# Patient Record
Sex: Female | Born: 1955 | Race: White | Hispanic: No | Marital: Married | Smoking: Never smoker
Health system: Southern US, Community
[De-identification: ages and names within clinical notes are randomized; demographics above are authoritative.]

## PROBLEM LIST (undated history)

## (undated) DIAGNOSIS — T7840XA Allergy, unspecified, initial encounter: Secondary | ICD-10-CM

## (undated) DIAGNOSIS — M810 Age-related osteoporosis without current pathological fracture: Secondary | ICD-10-CM

## (undated) DIAGNOSIS — D369 Benign neoplasm, unspecified site: Secondary | ICD-10-CM

## (undated) HISTORY — PX: TONSILLECTOMY: SUR1361

## (undated) HISTORY — DX: Benign neoplasm, unspecified site: D36.9

## (undated) HISTORY — DX: Allergy, unspecified, initial encounter: T78.40XA

## (undated) HISTORY — PX: OTHER SURGICAL HISTORY: SHX169

## (undated) HISTORY — PX: BREAST BIOPSY: SHX20

## (undated) HISTORY — PX: POLYPECTOMY: SHX149

## (undated) HISTORY — PX: COLONOSCOPY: SHX174

## (undated) HISTORY — PX: WISDOM TOOTH EXTRACTION: SHX21

## (undated) HISTORY — DX: Age-related osteoporosis without current pathological fracture: M81.0

---

## 1998-01-08 ENCOUNTER — Other Ambulatory Visit: Admission: RE | Admit: 1998-01-08 | Discharge: 1998-01-08 | Payer: Self-pay | Admitting: Obstetrics and Gynecology

## 1999-05-25 ENCOUNTER — Other Ambulatory Visit: Admission: RE | Admit: 1999-05-25 | Discharge: 1999-05-25 | Payer: Self-pay | Admitting: *Deleted

## 2000-05-29 ENCOUNTER — Other Ambulatory Visit: Admission: RE | Admit: 2000-05-29 | Discharge: 2000-05-29 | Payer: Self-pay | Admitting: *Deleted

## 2001-06-04 ENCOUNTER — Other Ambulatory Visit: Admission: RE | Admit: 2001-06-04 | Discharge: 2001-06-04 | Payer: Self-pay | Admitting: Obstetrics and Gynecology

## 2002-06-03 ENCOUNTER — Other Ambulatory Visit: Admission: RE | Admit: 2002-06-03 | Discharge: 2002-06-03 | Payer: Self-pay | Admitting: Obstetrics and Gynecology

## 2003-04-06 ENCOUNTER — Encounter: Admission: RE | Admit: 2003-04-06 | Discharge: 2003-04-06 | Payer: Self-pay | Admitting: Obstetrics and Gynecology

## 2003-06-10 ENCOUNTER — Other Ambulatory Visit: Admission: RE | Admit: 2003-06-10 | Discharge: 2003-06-10 | Payer: Self-pay | Admitting: Obstetrics and Gynecology

## 2003-09-30 ENCOUNTER — Encounter: Admission: RE | Admit: 2003-09-30 | Discharge: 2003-09-30 | Payer: Self-pay | Admitting: Obstetrics and Gynecology

## 2005-06-09 ENCOUNTER — Encounter: Admission: RE | Admit: 2005-06-09 | Discharge: 2005-06-09 | Payer: Self-pay | Admitting: Obstetrics and Gynecology

## 2008-01-08 ENCOUNTER — Encounter: Admission: RE | Admit: 2008-01-08 | Discharge: 2008-01-08 | Payer: Self-pay | Admitting: Obstetrics and Gynecology

## 2009-01-26 ENCOUNTER — Encounter: Admission: RE | Admit: 2009-01-26 | Discharge: 2009-01-26 | Payer: Self-pay | Admitting: Obstetrics and Gynecology

## 2010-08-02 ENCOUNTER — Other Ambulatory Visit: Payer: Self-pay | Admitting: Obstetrics and Gynecology

## 2010-08-02 DIAGNOSIS — Z780 Asymptomatic menopausal state: Secondary | ICD-10-CM

## 2010-08-12 ENCOUNTER — Ambulatory Visit
Admission: RE | Admit: 2010-08-12 | Discharge: 2010-08-12 | Disposition: A | Discharge status: Home / Self Care | Payer: 59 | Source: Ambulatory Visit | Attending: Obstetrics and Gynecology | Admitting: Obstetrics and Gynecology

## 2010-08-12 DIAGNOSIS — Z780 Asymptomatic menopausal state: Secondary | ICD-10-CM

## 2012-10-04 ENCOUNTER — Other Ambulatory Visit: Payer: Self-pay | Admitting: Obstetrics and Gynecology

## 2012-10-04 DIAGNOSIS — N644 Mastodynia: Secondary | ICD-10-CM

## 2012-10-18 ENCOUNTER — Ambulatory Visit
Admission: RE | Admit: 2012-10-18 | Discharge: 2012-10-18 | Disposition: A | Discharge status: Home / Self Care | Payer: 59 | Source: Ambulatory Visit | Attending: Obstetrics and Gynecology | Admitting: Obstetrics and Gynecology

## 2012-10-18 DIAGNOSIS — N644 Mastodynia: Secondary | ICD-10-CM

## 2013-03-04 ENCOUNTER — Encounter: Payer: Self-pay | Admitting: Internal Medicine

## 2013-04-28 ENCOUNTER — Ambulatory Visit (AMBULATORY_SURGERY_CENTER): Payer: Self-pay | Admitting: *Deleted

## 2013-04-28 VITALS — Ht 64.0 in | Wt 143.0 lb

## 2013-04-28 DIAGNOSIS — Z1211 Encounter for screening for malignant neoplasm of colon: Secondary | ICD-10-CM

## 2013-04-28 MED ORDER — PEG-KCL-NACL-NASULF-NA ASC-C 100 G PO SOLR: ORAL | Status: DC

## 2013-04-28 NOTE — Progress Notes (Signed)
No egg or soy allergy 

## 2013-05-06 ENCOUNTER — Encounter: Payer: Self-pay | Admitting: Internal Medicine

## 2013-05-12 ENCOUNTER — Encounter: Payer: Self-pay | Admitting: Internal Medicine

## 2013-05-12 ENCOUNTER — Ambulatory Visit (AMBULATORY_SURGERY_CENTER): Payer: 59 | Admitting: Internal Medicine

## 2013-05-12 VITALS — BP 140/53 | HR 70 | Temp 98.9°F | Resp 19 | Ht 64.0 in | Wt 143.0 lb

## 2013-05-12 DIAGNOSIS — Z1211 Encounter for screening for malignant neoplasm of colon: Secondary | ICD-10-CM

## 2013-05-12 DIAGNOSIS — D126 Benign neoplasm of colon, unspecified: Secondary | ICD-10-CM

## 2013-05-12 MED ORDER — SODIUM CHLORIDE 0.9 % IV SOLN: 500.0000 mL | INTRAVENOUS | Status: DC

## 2013-05-12 NOTE — Progress Notes (Signed)
Called to room to assist during endoscopic procedure.  Patient ID and intended procedure confirmed with present staff. Received instructions for my participation in the procedure from the performing physician.  

## 2013-05-12 NOTE — Progress Notes (Addendum)
NO EGG OR SOY ALLERGY. EWM NO PROBLEMS WITH PAST SEDATION. EWM PT STATES HER MOTHER HAD COLON CANCER AND THIS IS HER FIRST COLONOSCOPY. EMW

## 2013-05-12 NOTE — Patient Instructions (Signed)
Discharge instructions given with verbal understanding. Handout on polyps. Resume previous medications. YOU HAD AN ENDOSCOPIC PROCEDURE TODAY AT THE [facility]: Refer to the procedure report that was given to you for any specific questions about what was found during the examination.  If the procedure report does not answer your questions, please call your gastroenterologist to clarify.  If you requested that your care partner not be given the details of your procedure findings, then the procedure report has been included in a sealed envelope for you to review at your convenience later.  YOU SHOULD EXPECT: Some feelings of bloating in the abdomen. Passage of more gas than usual.  Walking can help get rid of the air that was put into your GI tract during the procedure and reduce the bloating. If you had a lower endoscopy (such as a colonoscopy or flexible sigmoidoscopy) you may notice spotting of blood in your stool or on the toilet paper. If you underwent a bowel prep for your procedure, then you may not have a normal bowel movement for a few days.  DIET: Your first meal following the procedure should be a light meal and then it is ok to progress to your normal diet.  A half-sandwich or bowl of soup is an example of a good first meal.  Heavy or fried foods are harder to digest and may make you feel nauseous or bloated.  Likewise meals heavy in dairy and vegetables can cause extra gas to form and this can also increase the bloating.  Drink plenty of fluids but you should avoid alcoholic beverages for 24 hours.  ACTIVITY: Your care partner should take you home directly after the procedure.  You should plan to take it easy, moving slowly for the rest of the day.  You can resume normal activity the day after the procedure however you should NOT DRIVE or use heavy machinery for 24 hours (because of the sedation medicines used during the test).    SYMPTOMS TO REPORT IMMEDIATELY: A  gastroenterologist can be reached at any hour.  During normal business hours, 8:30 AM to 5:00 PM Monday through Friday, call [phone].  After hours and on weekends, please call the GI answering service at [phone] who will take a message and have the physician on call contact you.   Following lower endoscopy (colonoscopy or flexible sigmoidoscopy):  Excessive amounts of blood in the stool  Significant tenderness or worsening of abdominal pains  Swelling of the abdomen that is new, acute  Fever of 100F or higher  FOLLOW UP: If any biopsies were taken you will be contacted by phone or by letter within the next 1-3 weeks.  Call your gastroenterologist if you have not heard about the biopsies in 3 weeks.  Our staff will call the home number listed on your records the next business day following your procedure to check on you and address any questions or concerns that you may have at that time regarding the information given to you following your procedure. This is a courtesy call and so if there is no answer at the home number and we have not heard from you through the emergency physician on call, we will assume that you have returned to your regular daily activities without incident.  SIGNATURES/CONFIDENTIALITY: You and/or your care partner have signed paperwork which will be entered into your electronic medical record.  These signatures attest to the fact that that the information above on your After Visit Summary has been   reviewed and is understood.  Full responsibility of the confidentiality of this discharge information lies with you and/or your care-partner. 

## 2013-05-12 NOTE — Op Note (Signed)
[facility]  [address] [place], [address]   COLONOSCOPY PROCEDURE REPORT  PATIENT: [name], [name]  MR#: [id number] BIRTHDATE: [birth year] , 32  yrs. old GENDER: Female ENDOSCOPIST: Eustace Quail, MD REFERRED YC:[id number] Tisovec, M.D. PROCEDURE DATE:  05/12/2013 PROCEDURE:   Colonoscopy with snare polypectomy x 2 First Screening Colonoscopy - Avg.  risk and is 50 yrs.  old or older Yes.  Prior Negative Screening - Now for repeat screening. N/A  History of Adenoma - Now for follow-up colonoscopy & has been > or = to 3 yrs.  N/A  Polyps Removed Today? Yes. ASA CLASS:   Class I INDICATIONS:average risk screening. MEDICATIONS: MAC sedation, administered by CRNA and propofol (Diprivan) 400mg  IV  DESCRIPTION OF PROCEDURE:   After the risks benefits and alternatives of the procedure were thoroughly explained, informed consent was obtained.  A digital rectal exam revealed no abnormalities of the rectum.   The LB [id number]  endoscope was introduced through the anus and advanced to the cecum, which was identified by both the appendix and ileocecal valve. No adverse events experienced.   The quality of the prep was excellent, using MoviPrep  The instrument was then slowly withdrawn as the colon was fully examined.      COLON FINDINGS: Two sessile polyps were found; 11 mm in the ascending colon and 7 mm in the transverse colon. The polyps were removed with snare cautery and retrieved for pathologic submission. The colon mucosa was otherwise normal throughout.  Retroflexed views revealed internal hemorrhoids. The time to cecum=3 minutes 25 seconds.  Withdrawal time=14 minutes 0 seconds.  The scope was withdrawn and the procedure completed. COMPLICATIONS: There were no complications.  ENDOSCOPIC IMPRESSION: 1.   Two sessile polyps were found in the ascending colon and transverse colon ; status post cold snare polypectomy 2.   The colon mucosa was  otherwise normal  RECOMMENDATIONS: 1. Repeat Colonoscopy in 3 years (greater than 1 cm adenoma).   eSigned:  Eustace Quail, MD 05/12/2013 10:16 AM   cc: Domenick Gong, MD and The Patient   PATIENT NAME:  Melissa Oliver, Melissa Oliver MR#: [id number]

## 2013-05-12 NOTE — Progress Notes (Signed)
Report to pacu rn, vss, bbs=clear 

## 2013-05-13 ENCOUNTER — Telehealth: Payer: Self-pay | Admitting: *Deleted

## 2013-05-13 NOTE — Telephone Encounter (Signed)
  Follow up Call-  Call back number 05/12/2013  Post procedure Call Back phone  # [phone]  Permission to leave phone message Yes     Patient questions:  Do you have a fever, pain , or abdominal swelling? no Pain Score  0 *  Have you tolerated food without any problems? yes  Have you been able to return to your normal activities? yes  Do you have any questions about your discharge instructions: Diet   no Medications  no Follow up visit  no  Do you have questions or concerns about your Care? no  Actions: * If pain score is 4 or above: No action needed, pain <4.

## 2013-05-15 ENCOUNTER — Encounter: Payer: Self-pay | Admitting: Internal Medicine

## 2016-04-05 ENCOUNTER — Other Ambulatory Visit: Payer: Self-pay | Admitting: Internal Medicine

## 2016-04-05 DIAGNOSIS — Z1231 Encounter for screening mammogram for malignant neoplasm of breast: Secondary | ICD-10-CM

## 2016-05-03 ENCOUNTER — Ambulatory Visit
Admission: RE | Admit: 2016-05-03 | Discharge: 2016-05-03 | Disposition: A | Discharge status: Home / Self Care | Payer: 59 | Source: Ambulatory Visit | Attending: Internal Medicine | Admitting: Internal Medicine

## 2016-05-03 DIAGNOSIS — Z1231 Encounter for screening mammogram for malignant neoplasm of breast: Secondary | ICD-10-CM

## 2016-05-09 ENCOUNTER — Encounter: Payer: Self-pay | Admitting: Internal Medicine

## 2017-03-06 DIAGNOSIS — H02831 Dermatochalasis of right upper eyelid: Secondary | ICD-10-CM | POA: Diagnosis not present

## 2017-03-06 DIAGNOSIS — H2513 Age-related nuclear cataract, bilateral: Secondary | ICD-10-CM | POA: Diagnosis not present

## 2017-03-06 DIAGNOSIS — H04123 Dry eye syndrome of bilateral lacrimal glands: Secondary | ICD-10-CM | POA: Diagnosis not present

## 2017-03-27 DIAGNOSIS — Z0000 Encounter for general adult medical examination without abnormal findings: Secondary | ICD-10-CM | POA: Diagnosis not present

## 2017-03-27 DIAGNOSIS — M859 Disorder of bone density and structure, unspecified: Secondary | ICD-10-CM | POA: Diagnosis not present

## 2017-04-03 ENCOUNTER — Other Ambulatory Visit: Payer: Self-pay | Admitting: Internal Medicine

## 2017-04-03 DIAGNOSIS — M5489 Other dorsalgia: Secondary | ICD-10-CM | POA: Diagnosis not present

## 2017-04-03 DIAGNOSIS — Z1231 Encounter for screening mammogram for malignant neoplasm of breast: Secondary | ICD-10-CM

## 2017-04-03 DIAGNOSIS — Z0000 Encounter for general adult medical examination without abnormal findings: Secondary | ICD-10-CM | POA: Diagnosis not present

## 2017-04-03 DIAGNOSIS — D126 Benign neoplasm of colon, unspecified: Secondary | ICD-10-CM | POA: Diagnosis not present

## 2017-04-03 DIAGNOSIS — G4709 Other insomnia: Secondary | ICD-10-CM | POA: Diagnosis not present

## 2017-04-05 ENCOUNTER — Other Ambulatory Visit: Payer: Self-pay | Admitting: Internal Medicine

## 2017-04-05 DIAGNOSIS — R5381 Other malaise: Secondary | ICD-10-CM

## 2017-04-06 ENCOUNTER — Other Ambulatory Visit: Payer: Self-pay | Admitting: Internal Medicine

## 2017-04-06 DIAGNOSIS — M8580 Other specified disorders of bone density and structure, unspecified site: Secondary | ICD-10-CM

## 2017-04-06 DIAGNOSIS — E559 Vitamin D deficiency, unspecified: Secondary | ICD-10-CM

## 2017-06-05 ENCOUNTER — Ambulatory Visit
Admission: RE | Admit: 2017-06-05 | Discharge: 2017-06-05 | Disposition: A | Discharge status: Home / Self Care | Payer: 59 | Source: Ambulatory Visit | Attending: Internal Medicine | Admitting: Internal Medicine

## 2017-06-05 DIAGNOSIS — M8580 Other specified disorders of bone density and structure, unspecified site: Secondary | ICD-10-CM

## 2017-06-05 DIAGNOSIS — Z780 Asymptomatic menopausal state: Secondary | ICD-10-CM | POA: Diagnosis not present

## 2017-06-05 DIAGNOSIS — M810 Age-related osteoporosis without current pathological fracture: Secondary | ICD-10-CM | POA: Diagnosis not present

## 2017-06-05 DIAGNOSIS — Z1231 Encounter for screening mammogram for malignant neoplasm of breast: Secondary | ICD-10-CM

## 2017-06-05 DIAGNOSIS — E559 Vitamin D deficiency, unspecified: Secondary | ICD-10-CM

## 2018-04-01 DIAGNOSIS — Z0000 Encounter for general adult medical examination without abnormal findings: Secondary | ICD-10-CM | POA: Diagnosis not present

## 2018-04-01 DIAGNOSIS — E559 Vitamin D deficiency, unspecified: Secondary | ICD-10-CM | POA: Diagnosis not present

## 2018-04-01 DIAGNOSIS — R82998 Other abnormal findings in urine: Secondary | ICD-10-CM | POA: Diagnosis not present

## 2018-04-08 DIAGNOSIS — M5489 Other dorsalgia: Secondary | ICD-10-CM | POA: Diagnosis not present

## 2018-04-08 DIAGNOSIS — Z1389 Encounter for screening for other disorder: Secondary | ICD-10-CM | POA: Diagnosis not present

## 2018-04-08 DIAGNOSIS — D126 Benign neoplasm of colon, unspecified: Secondary | ICD-10-CM | POA: Diagnosis not present

## 2018-04-08 DIAGNOSIS — M859 Disorder of bone density and structure, unspecified: Secondary | ICD-10-CM | POA: Diagnosis not present

## 2018-04-08 DIAGNOSIS — Z0000 Encounter for general adult medical examination without abnormal findings: Secondary | ICD-10-CM | POA: Diagnosis not present

## 2018-04-09 DIAGNOSIS — Z1212 Encounter for screening for malignant neoplasm of rectum: Secondary | ICD-10-CM | POA: Diagnosis not present

## 2019-05-15 ENCOUNTER — Encounter: Payer: Self-pay | Admitting: Internal Medicine

## 2019-05-27 ENCOUNTER — Ambulatory Visit (AMBULATORY_SURGERY_CENTER): Payer: Self-pay | Admitting: *Deleted

## 2019-05-27 ENCOUNTER — Other Ambulatory Visit: Payer: Self-pay

## 2019-05-27 VITALS — Temp 96.8°F | Ht 62.5 in | Wt 142.0 lb

## 2019-05-27 DIAGNOSIS — Z01818 Encounter for other preprocedural examination: Secondary | ICD-10-CM

## 2019-05-27 DIAGNOSIS — Z86010 Personal history of colonic polyps: Secondary | ICD-10-CM

## 2019-05-27 DIAGNOSIS — Z800 Family history of malignant neoplasm of digestive organs: Secondary | ICD-10-CM

## 2019-05-27 MED ORDER — SUPREP BOWEL PREP KIT 17.5-3.13-1.6 GM/177ML PO SOLN: 1.0000 | Freq: Once | ORAL | 0 refills | Status: AC

## 2019-05-27 NOTE — Progress Notes (Signed)
No egg or soy allergy known to patient  No issues with past sedation with any surgeries  or procedures, no intubation problems  No diet pills per patient No home 02 use per patient  No blood thinners per patient  Pt denies issues with constipation  No A fib or A flutter  EMMI video sent to pt's e mail  Suprep $15 coupon   Due to the COVID-19 pandemic we are asking patients to follow these guidelines. Please only bring one care partner. Please be aware that your care partner may wait in the car in the parking lot or if they feel like they will be too hot to wait in the car, they may wait in the lobby on the 4th floor. All care partners are required to wear a mask the entire time (we do not have any that we can provide them), they need to practice social distancing, and we will do a Covid check for all patient's and care partners when you arrive. Also we will check their temperature and your temperature. If the care partner waits in their car they need to stay in the parking lot the entire time and we will call them on their cell phone when the patient is ready for discharge so they can bring the car to the front of the building. Also all patient's will need to wear a mask into building.

## 2019-06-05 ENCOUNTER — Encounter: Payer: Self-pay | Admitting: Internal Medicine

## 2019-06-06 ENCOUNTER — Other Ambulatory Visit: Payer: Self-pay | Admitting: Internal Medicine

## 2019-06-06 ENCOUNTER — Ambulatory Visit (INDEPENDENT_AMBULATORY_CARE_PROVIDER_SITE_OTHER): Payer: 59

## 2019-06-06 ENCOUNTER — Other Ambulatory Visit: Payer: Self-pay

## 2019-06-06 DIAGNOSIS — Z1159 Encounter for screening for other viral diseases: Secondary | ICD-10-CM

## 2019-06-07 LAB — SARS CORONAVIRUS 2 (TAT 6-24 HRS): SARS Coronavirus 2: NEGATIVE

## 2019-06-10 ENCOUNTER — Ambulatory Visit (AMBULATORY_SURGERY_CENTER): Payer: 59 | Admitting: Internal Medicine

## 2019-06-10 ENCOUNTER — Other Ambulatory Visit: Payer: Self-pay

## 2019-06-10 ENCOUNTER — Encounter: Payer: Self-pay | Admitting: Internal Medicine

## 2019-06-10 VITALS — BP 109/49 | HR 70 | Temp 97.1°F | Resp 13 | Ht 62.0 in | Wt 142.0 lb

## 2019-06-10 DIAGNOSIS — D120 Benign neoplasm of cecum: Secondary | ICD-10-CM

## 2019-06-10 DIAGNOSIS — D123 Benign neoplasm of transverse colon: Secondary | ICD-10-CM

## 2019-06-10 DIAGNOSIS — Z86010 Personal history of colonic polyps: Secondary | ICD-10-CM | POA: Diagnosis present

## 2019-06-10 DIAGNOSIS — D122 Benign neoplasm of ascending colon: Secondary | ICD-10-CM

## 2019-06-10 MED ORDER — SODIUM CHLORIDE 0.9 % IV SOLN: 500.0000 mL | Freq: Once | INTRAVENOUS | Status: DC

## 2019-06-10 NOTE — Patient Instructions (Signed)
YOU HAD AN ENDOSCOPIC PROCEDURE TODAY AT [facility]:   Refer to the procedure report that was given to you for any specific questions about what was found during the examination.  If the procedure report does not answer your questions, please call your gastroenterologist to clarify.  If you requested that your care partner not be given the details of your procedure findings, then the procedure report has been included in a sealed envelope for you to review at your convenience later.  YOU SHOULD EXPECT: Some feelings of bloating in the abdomen. Passage of more gas than usual.  Walking can help get rid of the air that was put into your GI tract during the procedure and reduce the bloating. If you had a lower endoscopy (such as a colonoscopy or flexible sigmoidoscopy) you may notice spotting of blood in your stool or on the toilet paper. If you underwent a bowel prep for your procedure, you may not have a normal bowel movement for a few days.  Please Note:  You might notice some irritation and congestion in your nose or some drainage.  This is from the oxygen used during your procedure.  There is no need for concern and it should clear up in a day or so.  SYMPTOMS TO REPORT IMMEDIATELY:   Following lower endoscopy (colonoscopy or flexible sigmoidoscopy):  Excessive amounts of blood in the stool  Significant tenderness or worsening of abdominal pains  Swelling of the abdomen that is new, acute  Fever of 100F or higher   For urgent or emergent issues, a gastroenterologist can be reached at any hour by calling [phone].   DIET:  We do recommend a small meal at first, but then you may proceed to your regular diet.  Drink plenty of fluids but you should avoid alcoholic beverages for 24 hours.  ACTIVITY:  You should plan to take it easy for the rest of today and you should NOT DRIVE or use heavy machinery until tomorrow (because of the sedation medicines used during the test).     FOLLOW UP: Our staff will call the number listed on your records 48-72 hours following your procedure to check on you and address any questions or concerns that you may have regarding the information given to you following your procedure. If we do not reach you, we will leave a message.  We will attempt to reach you two times.  During this call, we will ask if you have developed any symptoms of COVID 19. If you develop any symptoms (ie: fever, flu-like symptoms, shortness of breath, cough etc.) before then, please call [phone].  If you test positive for Covid 19 in the 2 weeks post procedure, please call and report this information to [place].    If any biopsies were taken you will be contacted by phone or by letter within the next 1-3 weeks.  Please call us at [phone] if you have not heard about the biopsies in 3 weeks.    SIGNATURES/CONFIDENTIALITY: You and/or your care partner have signed paperwork which will be entered into your electronic medical record.  These signatures attest to the fact that that the information above on your After Visit Summary has been reviewed and is understood.  Full responsibility of the confidentiality of this discharge information lies with you and/or your care-partner.  Resume medications. Information given on polyps and hemorrhoids.

## 2019-06-10 NOTE — Progress Notes (Signed)
Called to room to assist during endoscopic procedure.  Patient ID and intended procedure confirmed with present staff. Received instructions for my participation in the procedure from the performing physician.  

## 2019-06-10 NOTE — Op Note (Signed)
[facility] Patient Name: Melissa Oliver Procedure Date: 06/10/2019 8:29 AM MRN: [id number] Endoscopist: Docia Chuck. Henrene Pastor , MD Age: 64 Referring MD:  Date of Birth: 1956/03/17 Gender: Female Account #: [id number] Procedure:                Colonoscopy with cold snare polypectomy x 5 Indications:              High risk colon cancer surveillance: Personal                            history of adenoma (10 mm or greater in size).                            Index examination January 2015 (overdue for                            follow-up) Medicines:                Monitored Anesthesia Care Procedure:                Pre-Anesthesia Assessment:                           - Prior to the procedure, a History and Physical                            was performed, and patient medications and                            allergies were reviewed. The patient's tolerance of                            previous anesthesia was also reviewed. The risks                            and benefits of the procedure and the sedation                            options and risks were discussed with the patient.                            All questions were answered, and informed consent                            was obtained. Prior Anticoagulants: The patient has                            taken no previous anticoagulant or antiplatelet                            agents. ASA Grade Assessment: II - A patient with                            mild systemic disease. After reviewing the risks  and benefits, the patient was deemed in                            satisfactory condition to undergo the procedure.                           After obtaining informed consent, the colonoscope                            was passed under direct vision. Throughout the                            procedure, the patient's blood pressure, pulse, and                            oxygen saturations were  monitored continuously. The                            Colonoscope was introduced through the anus and                            advanced to the the cecum, identified by                            appendiceal orifice and ileocecal valve. The                            ileocecal valve, appendiceal orifice, and rectum                            were photographed. The quality of the bowel                            preparation was excellent. The colonoscopy was                            performed without difficulty. The patient tolerated                            the procedure well. The bowel preparation used was                            SUPREP via split dose instruction. Scope In: 8:38:57 AM Scope Out: 8:58:54 AM Scope Withdrawal Time: 0 hours 15 minutes 48 seconds  Total Procedure Duration: 0 hours 19 minutes 57 seconds  Findings:                 Five sessile polyps were found in the transverse                            colon, ascending colon and cecum. The polyps were 1                            to 10 mm in size. These polyps were removed  with a                            cold snare. Resection and retrieval were complete.                           The exam was otherwise without abnormality on                            direct and retroflexion views. Internal hemorrhoids                            present. Complications:            No immediate complications. Estimated blood loss:                            None. Estimated Blood Loss:     Estimated blood loss: none. Impression:               - Five 1 to 10 mm polyps in the transverse colon,                            in the ascending colon and in the cecum, removed                            with a cold snare. Resected and retrieved.                           - The examination was otherwise normal on direct                            and retroflexion views. Internal hemorrhoids                            present. Recommendation:            - Repeat colonoscopy in 3 years for surveillance.                           - Patient has a contact number available for                            emergencies. The signs and symptoms of potential                            delayed complications were discussed with the                            patient. Return to normal activities tomorrow.                            Written discharge instructions were provided to the                            patient.                           -  Resume previous diet.                           - Continue present medications.                           - Await pathology results. Docia Chuck. Henrene Pastor, MD 06/10/2019 9:05:12 AM This report has been signed electronically.

## 2019-06-10 NOTE — Progress Notes (Signed)
Pt's states no medical or surgical changes since previsit or office visit. VS by DT. Temp by JB.

## 2019-06-10 NOTE — Progress Notes (Signed)
PT taken to PACU. Monitors in place. VSS. Report given to RN. 

## 2019-06-12 ENCOUNTER — Telehealth: Payer: Self-pay | Admitting: *Deleted

## 2019-06-12 ENCOUNTER — Encounter: Payer: Self-pay | Admitting: Internal Medicine

## 2019-06-12 ENCOUNTER — Telehealth: Payer: Self-pay

## 2019-06-12 NOTE — Telephone Encounter (Signed)
Attempted to reach patient for post-procedure f/u call. No answer. Left message that we will make another attempt to reach her later today and for her to please not hesitate to call us if she has any questions/concerns.

## 2019-06-12 NOTE — Telephone Encounter (Signed)
  Follow up Call-  Call back number 06/10/2019  Post procedure Call Back phone  # [phone]  Permission to leave phone message Yes  Some recent data might be hidden     Patient questions:  Do you have a fever, pain , or abdominal swelling? No. Pain Score  0 *  Have you tolerated food without any problems? Yes.    Have you been able to return to your normal activities? Yes.    Do you have any questions about your discharge instructions: Diet   No. Medications  No. Follow up visit  No.  Do you have questions or concerns about your Care? No.  Actions: * If pain score is 4 or above: No action needed, pain <4.   1. Have you developed a fever since your procedure? no  2.   Have you had an respiratory symptoms (SOB or cough) since your procedure? no  3.   Have you tested positive for COVID 19 since your procedure no  4.   Have you had any family members/close contacts diagnosed with the COVID 19 since your procedure?  no   If yes to any of these questions please route to Joylene John, RN and Alphonsa Gin, Therapist, sports.

## 2019-07-11 ENCOUNTER — Ambulatory Visit: Payer: 59 | Attending: Internal Medicine

## 2019-07-11 DIAGNOSIS — Z23 Encounter for immunization: Secondary | ICD-10-CM

## 2019-07-11 NOTE — Progress Notes (Signed)
   Covid-19 Vaccination Clinic  Name:  Melissa Oliver    MRN: [id number] DOB: 04/06/1956  07/11/2019  Melissa Oliver was observed post Covid-19 immunization for 15 mins without incident. She was provided with Vaccine Information Sheet and instruction to access the V-Safe system.   Melissa Oliver was instructed to call 911 with any severe reactions post vaccine: Marland Kitchen Difficulty breathing  . Swelling of face and throat  . A fast heartbeat  . A bad rash all over body  . Dizziness and weakness   Immunizations Administered    Name Date Dose VIS Date Route   Pfizer COVID-19 Vaccine 07/11/2019 11:26 AM 0.3 mL 04/11/2019 Intramuscular   Manufacturer: [place]   Lot: [id number]   [place]: [id number]

## 2019-08-05 ENCOUNTER — Ambulatory Visit: Payer: 59 | Attending: Internal Medicine

## 2019-08-05 DIAGNOSIS — Z23 Encounter for immunization: Secondary | ICD-10-CM

## 2019-08-05 NOTE — Progress Notes (Signed)
   Covid-19 Vaccination Clinic  Name:  Melissa Oliver    MRN: [id number] DOB: 05-Aug-1955  08/05/2019  Ms. Melissa Oliver was observed post Covid-19 immunization for 15 minutes without incident. She was provided with Vaccine Information Sheet and instruction to access the V-Safe system.   Ms. Melissa Oliver was instructed to call 911 with any severe reactions post vaccine: Marland Kitchen Difficulty breathing  . Swelling of face and throat  . A fast heartbeat  . A bad rash all over body  . Dizziness and weakness   Immunizations Administered    Name Date Dose VIS Date Route   Pfizer COVID-19 Vaccine 08/05/2019  9:13 AM 0.3 mL 04/11/2019 Intramuscular   Manufacturer: [employer], [employer]   Lot: [id number]   [place]: [id number]

## 2022-05-17 ENCOUNTER — Encounter: Payer: Self-pay | Admitting: Internal Medicine

## 2022-07-03 ENCOUNTER — Other Ambulatory Visit: Payer: Self-pay | Admitting: Internal Medicine

## 2022-07-03 DIAGNOSIS — Z0000 Encounter for general adult medical examination without abnormal findings: Secondary | ICD-10-CM

## 2022-07-14 ENCOUNTER — Ambulatory Visit
Admission: RE | Admit: 2022-07-14 | Discharge: 2022-07-14 | Disposition: A | Discharge status: Home / Self Care | Payer: Medicare Other | Source: Ambulatory Visit | Attending: Internal Medicine | Admitting: Internal Medicine

## 2022-07-14 DIAGNOSIS — Z0000 Encounter for general adult medical examination without abnormal findings: Secondary | ICD-10-CM

## 2023-08-14 ENCOUNTER — Other Ambulatory Visit: Payer: Self-pay | Admitting: Internal Medicine

## 2023-08-14 DIAGNOSIS — Z1231 Encounter for screening mammogram for malignant neoplasm of breast: Secondary | ICD-10-CM

## 2023-09-10 ENCOUNTER — Ambulatory Visit
Admission: RE | Admit: 2023-09-10 | Discharge: 2023-09-10 | Disposition: A | Discharge status: Home / Self Care | Source: Ambulatory Visit | Attending: Internal Medicine | Admitting: Internal Medicine

## 2023-09-10 DIAGNOSIS — Z1231 Encounter for screening mammogram for malignant neoplasm of breast: Secondary | ICD-10-CM
